# Patient Record
Sex: Male | Born: 1985 | Hispanic: No | Marital: Single
Health system: Southern US, Community
[De-identification: ages and names within clinical notes are randomized; demographics above are authoritative.]

---

## 2014-09-18 ENCOUNTER — Emergency Department (HOSPITAL_COMMUNITY): Payer: BC Managed Care – PPO

## 2014-09-18 ENCOUNTER — Emergency Department (HOSPITAL_COMMUNITY)
Admission: EM | Admit: 2014-09-18 | Discharge: 2014-09-18 | Disposition: A | Discharge status: Home / Self Care | Payer: BC Managed Care – PPO | Attending: Emergency Medicine | Admitting: Emergency Medicine

## 2014-09-18 DIAGNOSIS — S3992XA Unspecified injury of lower back, initial encounter: Secondary | ICD-10-CM | POA: Insufficient documentation

## 2014-09-18 DIAGNOSIS — Y9389 Activity, other specified: Secondary | ICD-10-CM | POA: Insufficient documentation

## 2014-09-18 DIAGNOSIS — Y92410 Unspecified street and highway as the place of occurrence of the external cause: Secondary | ICD-10-CM | POA: Insufficient documentation

## 2014-09-18 DIAGNOSIS — S0031XA Abrasion of nose, initial encounter: Secondary | ICD-10-CM | POA: Diagnosis not present

## 2014-09-18 DIAGNOSIS — S0990XA Unspecified injury of head, initial encounter: Secondary | ICD-10-CM | POA: Diagnosis not present

## 2014-09-18 DIAGNOSIS — Y998 Other external cause status: Secondary | ICD-10-CM | POA: Diagnosis not present

## 2014-09-18 DIAGNOSIS — S0992XA Unspecified injury of nose, initial encounter: Secondary | ICD-10-CM | POA: Diagnosis present

## 2014-09-18 MED ORDER — METHOCARBAMOL 500 MG PO TABS: 500.0000 mg | ORAL_TABLET | Freq: Two times a day (BID) | ORAL | Status: AC

## 2014-09-18 MED ORDER — NAPROXEN 500 MG PO TABS: 500.0000 mg | ORAL_TABLET | Freq: Two times a day (BID) | ORAL | Status: AC | PRN
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
## 2014-09-18 NOTE — ED Notes (Signed)
Pt A&Ox4, ambulatory at d/c with steady gait, NAD 
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
